# Patient Record
Sex: Female | Born: 1980 | Race: Black or African American | Hispanic: No | Marital: Single | Smoking: Never smoker
Health system: Southern US, Community
[De-identification: ages and names within clinical notes are randomized; demographics above are authoritative.]

---

## 2012-10-15 ENCOUNTER — Emergency Department (HOSPITAL_COMMUNITY)
Admission: EM | Admit: 2012-10-15 | Discharge: 2012-10-15 | Discharge status: Absent without leave | Payer: Self-pay | Source: Home / Self Care

## 2014-04-03 ENCOUNTER — Encounter (HOSPITAL_COMMUNITY): Payer: Self-pay | Admitting: Emergency Medicine

## 2014-04-03 ENCOUNTER — Emergency Department (HOSPITAL_COMMUNITY)
Admission: EM | Admit: 2014-04-03 | Discharge: 2014-04-03 | Disposition: A | Discharge status: Home / Self Care | Payer: Medicaid Other | Attending: Emergency Medicine | Admitting: Emergency Medicine

## 2014-04-03 DIAGNOSIS — S01511A Laceration without foreign body of lip, initial encounter: Secondary | ICD-10-CM

## 2014-04-03 DIAGNOSIS — Y92410 Unspecified street and highway as the place of occurrence of the external cause: Secondary | ICD-10-CM | POA: Diagnosis not present

## 2014-04-03 DIAGNOSIS — S40019A Contusion of unspecified shoulder, initial encounter: Secondary | ICD-10-CM | POA: Diagnosis not present

## 2014-04-03 DIAGNOSIS — S01501A Unspecified open wound of lip, initial encounter: Secondary | ICD-10-CM | POA: Diagnosis not present

## 2014-04-03 DIAGNOSIS — IMO0002 Reserved for concepts with insufficient information to code with codable children: Secondary | ICD-10-CM | POA: Diagnosis present

## 2014-04-03 DIAGNOSIS — M549 Dorsalgia, unspecified: Secondary | ICD-10-CM | POA: Insufficient documentation

## 2014-04-03 MED ORDER — IBUPROFEN 800 MG PO TABS: 800.0000 mg | ORAL_TABLET | Freq: Three times a day (TID) | ORAL | Status: DC | PRN

## 2014-04-03 MED ORDER — AMOXICILLIN 500 MG PO CAPS: 500.0000 mg | ORAL_CAPSULE | Freq: Three times a day (TID) | ORAL | Status: DC
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
## 2014-04-05 NOTE — ED Provider Notes (Signed)
Medical screening examination/treatment/procedure(s) were performed by non-physician practitioner and as supervising physician I was immediately available for consultation/collaboration.   EKG Interpretation None       Derwood KaplanAnkit Haylen Shelnutt, MD 04/05/14 [phone]

## 2014-06-26 ENCOUNTER — Emergency Department (HOSPITAL_COMMUNITY)
Admission: EM | Admit: 2014-06-26 | Discharge: 2014-06-27 | Disposition: A | Discharge status: Home / Self Care | Payer: No Typology Code available for payment source | Attending: Emergency Medicine | Admitting: Emergency Medicine

## 2014-06-26 ENCOUNTER — Encounter (HOSPITAL_COMMUNITY): Payer: Self-pay | Admitting: Emergency Medicine

## 2014-06-26 DIAGNOSIS — Y92410 Unspecified street and highway as the place of occurrence of the external cause: Secondary | ICD-10-CM | POA: Insufficient documentation

## 2014-06-26 DIAGNOSIS — S8990XA Unspecified injury of unspecified lower leg, initial encounter: Secondary | ICD-10-CM | POA: Insufficient documentation

## 2014-06-26 DIAGNOSIS — Y9389 Activity, other specified: Secondary | ICD-10-CM | POA: Insufficient documentation

## 2014-06-26 DIAGNOSIS — Z791 Long term (current) use of non-steroidal anti-inflammatories (NSAID): Secondary | ICD-10-CM | POA: Insufficient documentation

## 2014-06-26 DIAGNOSIS — S99919A Unspecified injury of unspecified ankle, initial encounter: Principal | ICD-10-CM

## 2014-06-26 DIAGNOSIS — M25561 Pain in right knee: Secondary | ICD-10-CM

## 2014-06-26 DIAGNOSIS — S99929A Unspecified injury of unspecified foot, initial encounter: Principal | ICD-10-CM
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
## 2014-06-27 ENCOUNTER — Emergency Department (HOSPITAL_COMMUNITY): Payer: No Typology Code available for payment source

## 2014-06-27 MED ORDER — NAPROXEN 500 MG PO TABS: 500.0000 mg | ORAL_TABLET | Freq: Two times a day (BID) | ORAL | Status: AC

## 2014-06-27 NOTE — ED Notes (Signed)
Patient was discharged with all personal belongings. 

## 2014-06-27 NOTE — Discharge Instructions (Signed)
1. Medications: naprosyn, usual home medications 2. Treatment: rest, drink plenty of fluids, ice, elevate 3. Follow Up: Please followup with your primary doctor for discussion of your diagnoses and further evaluation after today's visit; if you do not have a primary care doctor use the resource guide provided to find one; If no improvement see the orthopedist.     Arthralgia Your caregiver has diagnosed you as suffering from an arthralgia. Arthralgia means there is pain in a joint. This can come from many reasons including:  Bruising the joint which causes soreness (inflammation) in the joint.  Wear and tear on the joints which occur as we grow older (osteoarthritis).  Overusing the joint.  Various forms of arthritis.  Infections of the joint. Regardless of the cause of pain in your joint, most of these different pains respond to anti-inflammatory drugs and rest. The exception to this is when a joint is infected, and these cases are treated with antibiotics, if it is a bacterial infection. HOME CARE INSTRUCTIONS   Rest the injured area for as long as directed by your caregiver. Then slowly start using the joint as directed by your caregiver and as the pain allows. Crutches as directed may be useful if the ankles, knees or hips are involved. If the knee was splinted or casted, continue use and care as directed. If an stretchy or elastic wrapping bandage has been applied today, it should be removed and re-applied every 3 to 4 hours. It should not be applied tightly, but firmly enough to keep swelling down. Watch toes and feet for swelling, bluish discoloration, coldness, numbness or excessive pain. If any of these problems (symptoms) occur, remove the ace bandage and re-apply more loosely. If these symptoms persist, contact your caregiver or return to this location.  For the first 24 hours, keep the injured extremity elevated on pillows while lying down.  Apply ice for 15-20 minutes to the sore  joint every couple hours while awake for the first half day. Then 03-04 times per day for the first 48 hours. Put the ice in a plastic bag and place a towel between the bag of ice and your skin.  Wear any splinting, casting, elastic bandage applications, or slings as instructed.  Only take over-the-counter or prescription medicines for pain, discomfort, or fever as directed by your caregiver. Do not use aspirin immediately after the injury unless instructed by your physician. Aspirin can cause increased bleeding and bruising of the tissues.  If you were given crutches, continue to use them as instructed and do not resume weight bearing on the sore joint until instructed. Persistent pain and inability to use the sore joint as directed for more than 2 to 3 days are warning signs indicating that you should see a caregiver for a follow-up visit as soon as possible. Initially, a hairline fracture (break in bone) may not be evident on X-rays. Persistent pain and swelling indicate that further evaluation, non-weight bearing or use of the joint (use of crutches or slings as instructed), or further X-rays are indicated. X-rays may sometimes not show a small fracture until a week or 10 days later. Make a follow-up appointment with your own caregiver or one to whom we have referred you. A radiologist (specialist in reading X-rays) may read your X-rays. Make sure you know how you are to obtain your X-ray results. Do not assume everything is normal if you do not hear from [place]. SEEK MEDICAL CARE IF: Bruising, swelling, or pain increases. SEEK IMMEDIATE  MEDICAL CARE IF:   Your fingers or toes are numb or blue.  The pain is not responding to medications and continues to stay the same or get worse.  The pain in your joint becomes severe.  You develop a fever over 102 F (38.9 C).  It becomes impossible to move or use the joint. MAKE SURE YOU:   Understand these instructions.  Will watch your  condition.  Will get help right away if you are not doing well or get worse. Document Released: 11/17/2005 Document Revised: 02/09/2012 Document Reviewed: 07/05/2008 [facility]ExitCare Patient Information 2015 [place]ExitCare, [place]LLC. This information is not intended to replace advice given to you by your health care provider. Make sure you discuss any questions you have with your health care provider.

## 2014-06-27 NOTE — ED Provider Notes (Signed)
Medical screening examination/treatment/procedure(s) were performed by non-physician practitioner and as supervising physician I was immediately available for consultation/collaboration.   EKG Interpretation None       Pakou Rainbow M Daxen Lanum, MD 06/27/14 0548 
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
